# Patient Record
Sex: Female | Born: 1965 | Race: White | Hispanic: No | Marital: Single | Smoking: Current every day smoker
Health system: Southern US, Community
[De-identification: ages and names within clinical notes are randomized; demographics above are authoritative.]

## PROBLEM LIST (undated history)

## (undated) DIAGNOSIS — G629 Polyneuropathy, unspecified: Secondary | ICD-10-CM

## (undated) DIAGNOSIS — D71 Functional disorders of polymorphonuclear neutrophils: Secondary | ICD-10-CM

## (undated) DIAGNOSIS — J449 Chronic obstructive pulmonary disease, unspecified: Secondary | ICD-10-CM

## (undated) DIAGNOSIS — E7800 Pure hypercholesterolemia, unspecified: Secondary | ICD-10-CM

## (undated) DIAGNOSIS — I10 Essential (primary) hypertension: Secondary | ICD-10-CM

## (undated) DIAGNOSIS — D718 Other functional disorders of polymorphonuclear neutrophils: Secondary | ICD-10-CM

## (undated) DIAGNOSIS — F319 Bipolar disorder, unspecified: Secondary | ICD-10-CM

## (undated) DIAGNOSIS — K5090 Crohn's disease, unspecified, without complications: Secondary | ICD-10-CM

## (undated) DIAGNOSIS — M797 Fibromyalgia: Secondary | ICD-10-CM

## (undated) DIAGNOSIS — J45909 Unspecified asthma, uncomplicated: Secondary | ICD-10-CM

## (undated) DIAGNOSIS — M3500 Sicca syndrome, unspecified: Secondary | ICD-10-CM

## (undated) DIAGNOSIS — M479 Spondylosis, unspecified: Secondary | ICD-10-CM

## (undated) DIAGNOSIS — H527 Unspecified disorder of refraction: Secondary | ICD-10-CM

## (undated) DIAGNOSIS — K116 Mucocele of salivary gland: Secondary | ICD-10-CM

## (undated) HISTORY — DX: Mucocele of salivary gland: K11.6

## (undated) HISTORY — DX: Functional disorders of polymorphonuclear neutrophils: D71

## (undated) HISTORY — DX: Fibromyalgia: M79.7

## (undated) HISTORY — DX: Sjogren syndrome, unspecified: M35.00

## (undated) HISTORY — DX: Pure hypercholesterolemia, unspecified: E78.00

## (undated) HISTORY — DX: Bipolar disorder, unspecified: F31.9

## (undated) HISTORY — DX: Essential (primary) hypertension: I10

## (undated) HISTORY — DX: Crohn's disease, unspecified, without complications: K50.90

## (undated) HISTORY — DX: Chronic obstructive pulmonary disease, unspecified: J44.9

## (undated) HISTORY — DX: Spondylosis, unspecified: M47.9

## (undated) HISTORY — DX: Unspecified disorder of refraction: H52.7

## (undated) HISTORY — PX: TUBAL LIGATION: SHX77

## (undated) HISTORY — PX: FOOT SURGERY: SHX648

## (undated) HISTORY — PX: TONSILLECTOMY: SUR1361

## (undated) HISTORY — PX: OTHER SURGICAL HISTORY: SHX169

## (undated) HISTORY — PX: CHOLECYSTECTOMY: SHX55

---

## 2003-08-15 ENCOUNTER — Encounter: Payer: Self-pay | Admitting: Cardiology

## 2008-11-29 ENCOUNTER — Ambulatory Visit
Admit: 2008-11-29 | Discharge: 2008-12-10 | Disposition: A | Discharge status: Home / Self Care | Payer: Self-pay | Source: Ambulatory Visit | Attending: Psychiatry | Admitting: Psychiatry

## 2008-12-11 ENCOUNTER — Ambulatory Visit
Admit: 2008-12-11 | Discharge: 2009-01-10 | Disposition: A | Discharge status: Home / Self Care | Payer: Self-pay | Source: Ambulatory Visit | Attending: Psychiatry | Admitting: Psychiatry

## 2008-12-12 LAB — ETHANOL, URINE: Ethanol,UR: NEGATIVE

## 2008-12-12 LAB — THC SEMI-QUANT, URINE
Semi-Quant THC,UR: 160 ng/mL
THC/Creat Ratio: 516 ng/mg

## 2008-12-12 LAB — QUANT THC, URINE: Creatinine,UR: 31 mg/dL (ref 20–300)

## 2008-12-12 LAB — DRUG SCREEN CHEMICAL DEPENDENCY, URINE
Amphetamine,UR: NEGATIVE
Benzodiazepinen,UR: NEGATIVE
Cocaine/Metab,UR: NEGATIVE
Opiates,UR: NEGATIVE
THC Metabolite,UR: POSITIVE

## 2008-12-12 LAB — CONFIRM THC METABOLITE, URINE: Confirm THC Metab: POSITIVE

## 2008-12-13 LAB — DATE/TIME NOT PROVIDED

## 2008-12-14 LAB — DRUG SCREEN CHEMICAL DEPENDENCY, URINE
Amphetamine,UR: NEGATIVE
Benzodiazepinen,UR: NEGATIVE
Cocaine/Metab,UR: NEGATIVE
Opiates,UR: NEGATIVE
THC Metabolite,UR: POSITIVE

## 2008-12-14 LAB — CONFIRM THC METABOLITE, URINE: Confirm THC Metab: POSITIVE

## 2009-01-11 ENCOUNTER — Ambulatory Visit
Admit: 2009-01-11 | Discharge: 2009-02-10 | Disposition: A | Discharge status: Home / Self Care | Payer: Self-pay | Source: Ambulatory Visit | Attending: Psychiatry | Admitting: Psychiatry

## 2009-03-24 ENCOUNTER — Ambulatory Visit: Payer: Self-pay | Admitting: Otolaryngology

## 2009-05-07 ENCOUNTER — Ambulatory Visit: Admit: 2009-05-07 | Discharge: 2009-05-07 | Disposition: A | Discharge status: Home / Self Care | Payer: Self-pay

## 2009-06-17 ENCOUNTER — Ambulatory Visit: Admit: 2009-06-17 | Discharge: 2009-06-17 | Disposition: A | Discharge status: Home / Self Care | Payer: Self-pay

## 2009-09-17 ENCOUNTER — Ambulatory Visit: Payer: Self-pay | Admitting: Allergy/Immunology/Rheumatology

## 2009-09-17 ENCOUNTER — Other Ambulatory Visit: Payer: Self-pay | Admitting: Allergy/Immunology/Rheumatology

## 2009-09-17 LAB — C3 COMPLEMENT: C3: 132 mg/dL (ref 90–180)

## 2009-09-17 LAB — C4 COMPLEMENT: C4: 34 mg/dL (ref 10–40)

## 2009-09-17 LAB — CRP: CRP: 4 mg/L (ref 0–10)

## 2009-09-17 LAB — SEDIMENTATION RATE, AUTOMATED: Sedimentation Rate: 29 mm/h — ABNORMAL HIGH (ref 0–20)

## 2009-09-18 LAB — ANTINUCLEAR ANTIBODY SCREEN: ANA Screen: NEGATIVE

## 2009-09-19 LAB — ANTI RNP/SMITH
Anti-RNP: 1.1 AI — ABNORMAL HIGH (ref 0.0–0.9)
Anti-Smith: <0.2 AI (ref 0.0–0.9)
SM/RNP AB: <0.2 AI (ref 0.0–0.9)

## 2009-09-19 LAB — ANTI-SSA/SSB
Anti-LA/SS-B: <0.2 AI (ref 0.0–0.9)
Anti-RO/SS-A: <0.2 AI (ref 0.0–0.9)

## 2009-09-19 LAB — ANTI DS-DNA AB: dsDNA Ab: <1 [IU]/mL (ref 0–4)

## 2009-09-19 NOTE — Progress Notes (Signed)
Chief Complaint     Evaluation for joint pain and lymphadenopathy  HPI   I had the pleasure of seeing Kathy Barrett in my clinic at the request of Dr.  Daneil Dan for evaluation of lymphadenopathy and joint pain.     Kathy Barrett tells me that she has had chronic pain for years and she is hurting  all over.  Basically all of the joints seem to be affected.  She also has  S1/S2 sciatica. There are plans I believe for her to go to the [facility]  for pain management.  The symptoms are worse in the last year. She is  taking Mobic twice a day with no help.  She hurts all the time but worse in  cold damp weather and also at the end of the day.  She has swelling of the  ankles.  She is stiff in the morning for about 1 hour. She was diagnosed  with Crohn's disease with a biopsy.  All of the work up has been done at  [facility].     She also had an episode of parotitis on the right side and she has had  persistent lymphadenopathy since then. She has been on antibiotics for 6  months.  I saw in 1 note that as a child she was diagnosed with chronic  granulomatous disease but she has not had issues with infection throughout  the years.     She reports no Raynauds, no rashes, no photosensitivity, no sores in the  mouth. She has some sores in the nose, dry mouth, shortness of breath  secondary to the COPD, diarrhea, bloating, cramping, occasional bloody  bowel movements..  Allergies   None.  Current Meds   Risperidone 0.5 MG Tablet;Take 1 tablet twice daily as needed for   mania/agitation; take 2 tablets at bedtime; Rx  HydrOXYzine HCl 50 MG Tablet;TAKE 1 TABLET 4 TIMES DAILY PRN anxiety; Rx  LamoTRIgine 25 MG Tablet;TAKE 1 TABLET DAILY AT BEDTIME.; Rx  Venlafaxine HCl 150 MG Tablet Extended Release 24 Hour;TAKE 1 TABLET DAILY   IN THE MORNING; Rx.  ROS   CONSTITUTIONAL: Appetite good; no weight loss.  No fevers, chills, or   sweats.  No fatigue.  NEUROLOGICAL:  No headaches, numbness, weak muscles or fainting or dizzy    spells.  RESPIRATORY:  + chronic cough no hemoptysis; + shortness of breath; no   wheezing or loud snoring.  CARDIOVASCULAR:  No heart palpitations, angina or chest pain.  No change in   color of fingers in the cold.  INTEGUMENT:  No hair loss.  No rashes or bumps on the skin; no rash in the   sun.  No skin thickening.  MUSCULOSKELETAL:  As per HPI  EENT:  No eye pain, dry eyes, redness in the eyes; no vision loss.  No   ringing in the ears or hearing loss.  No nasal congestion.  + dry mouth, no   mouth sores or ulcers; no recent dental work.  No hoarseness or sore throat.  HEMATOLOGIC:  No swollen glands.  No lump or growth.  No bleeding tendency.  GASTROINTESTINAL:  No difficulty swallowing.  No heartburn.  No nausea or   vomiting; + abdominal pain.  + constipation or diarrhea.  + rectal bleeding   or black bowel movements.  GENITOURINARY:  No problems passing urine.  No burning on urination.  No   blood in the urine.  No discharge from penis/vagina.  No genital sores or  rash.  No possibility of pregnancy.  No breast lump.  PSYCHIATRIC:  No anxiety or depression.  No difficulty falling asleep or   staying asleep.  Active Problems   None.  PMH   Bipolar  Crohn's disease  HTN  hyperlipidemia  history of alcohol abuse.  Family Hx   Mother - SLE and MS.  Personal Hx   On SSD due to bipolar and DJD  smoking 1ppd X 33 years, had problems with alcohol, now drinks rarely.  Vital Signs   See scanned sheet.  Physical Exam   GENERAL APPEARANCE: Appears stated age, very, very strong tobaco odor  HEENT: PERRL, EOMI,  oropharynx clear, dry mouth, poor dentition, small   palpable lymphadenopathy R submandibular  LUNGS: Diffuse ronchi bilateral  HEART: Normal S1,S2 without murmurs, gallops, or rubs  ABDOMEN: NABS, soft, non-tender, without hepato-splenomegaly  EXTREMITIES: Without clubbing, cyanosis, or edema  NEUROLOGIC: Alert and oriented x3, cranial nerves II-XII grossly intact  MSK: all fibromyalgia points tender, no  synovitis  SKIN: tatoos.  Assessment   This is a 44 year old female with chronic pain and fibromyalgia.  I do not  think there is any doubt regarding these two diagnoses.  Her physical exam  is very much suggestive of that and I have not seen anything to suggest an  active inflammatory arthropathy.  Nevertheless with Crohn's disease one  could get such an inflammatory arthropathy.  Since there is no serological  data available to make that diagnosis, I think a bone scan might be helpful  in that regard.     Regarding the lymphadenopathy it is probably related to the parotitis. From  the very limited notes I have it looks like she had a parotid abscess.  She  does have dry mouth and with the parotic enlargement, I do wonder about the  possibility of Sjogren's although she is on a lot of medications that could  give her dryness of the mouth.  Nevertheless, given the family history of  autoimmune disease (her mother has lupus and multiple sclerosis) we will do  that work up.     PLAN:     1. Check ANA, ENAs, double stranded DNA, complements, sedimentation rate     and CRP.     2. Whole body bone scan.     3. Follow up on the results and let her know if she needs to return to     the clinic.     Thank you for this referral..  Signature   Electronically signed by: Harland German  M.D.; 09/19/2009 12:56 PM EST;   Chartered loss adjuster.

## 2009-09-25 ENCOUNTER — Ambulatory Visit: Admit: 2009-09-25 | Payer: Self-pay | Source: Ambulatory Visit | Admitting: Allergy/Immunology/Rheumatology

## 2009-11-28 ENCOUNTER — Other Ambulatory Visit: Payer: Self-pay | Admitting: Allergy/Immunology/Rheumatology

## 2009-12-09 ENCOUNTER — Ambulatory Visit
Admit: 2009-12-09 | Discharge: 2009-12-09 | Disposition: A | Discharge status: Home / Self Care | Payer: Self-pay | Source: Ambulatory Visit | Attending: Allergy/Immunology/Rheumatology | Admitting: Allergy/Immunology/Rheumatology

## 2009-12-10 ENCOUNTER — Other Ambulatory Visit: Payer: Self-pay | Admitting: Radiology

## 2011-10-19 ENCOUNTER — Encounter: Payer: Self-pay | Admitting: Ophthalmology

## 2011-10-19 ENCOUNTER — Ambulatory Visit: Payer: Self-pay | Admitting: Ophthalmology

## 2011-10-19 VITALS — BP 117/69 | HR 77 | Ht 62.0 in | Wt 160.0 lb

## 2011-10-19 DIAGNOSIS — M479 Spondylosis, unspecified: Secondary | ICD-10-CM | POA: Insufficient documentation

## 2011-10-19 DIAGNOSIS — M797 Fibromyalgia: Secondary | ICD-10-CM | POA: Insufficient documentation

## 2011-10-19 DIAGNOSIS — G510 Bell's palsy: Secondary | ICD-10-CM

## 2011-10-19 DIAGNOSIS — H527 Unspecified disorder of refraction: Secondary | ICD-10-CM | POA: Insufficient documentation

## 2011-10-19 DIAGNOSIS — K116 Mucocele of salivary gland: Secondary | ICD-10-CM | POA: Insufficient documentation

## 2011-10-19 DIAGNOSIS — M3500 Sicca syndrome, unspecified: Secondary | ICD-10-CM | POA: Insufficient documentation

## 2011-10-19 DIAGNOSIS — S0430XA Injury of trigeminal nerve, unspecified side, initial encounter: Secondary | ICD-10-CM

## 2011-10-19 DIAGNOSIS — F319 Bipolar disorder, unspecified: Secondary | ICD-10-CM | POA: Insufficient documentation

## 2011-10-19 DIAGNOSIS — I10 Essential (primary) hypertension: Secondary | ICD-10-CM | POA: Insufficient documentation

## 2011-10-19 DIAGNOSIS — D71 Functional disorders of polymorphonuclear neutrophils: Secondary | ICD-10-CM | POA: Insufficient documentation

## 2011-10-19 DIAGNOSIS — E7800 Pure hypercholesterolemia, unspecified: Secondary | ICD-10-CM | POA: Insufficient documentation

## 2011-10-19 DIAGNOSIS — J449 Chronic obstructive pulmonary disease, unspecified: Secondary | ICD-10-CM | POA: Insufficient documentation

## 2011-10-19 NOTE — Assessment & Plan Note (Signed)
The patient has evidence of a mandibular branch of cranial nerve V involvement on a post-surgical basis.

## 2011-10-19 NOTE — Progress Notes (Deleted)
Subjective:           Current Outpatient Rx   Name  Route  Sig  Dispense  Refill   . sulfamethoxazole-trimethoprim (BACTRIM,SEPTRA) 400-80 MG per tablet    Oral    Take 1 tablet by mouth daily             . prochlorperazine (COMPAZINE) 10 MG tablet    Oral    Take 10 mg by mouth 2 times daily as needed             . DULoxetine (CYMBALTA) 60 MG capsule    Oral    Take 60 mg by mouth daily             . meloxicam (MOBIC) 15 MG tablet    Oral    Take 15 mg by mouth daily   Take with food.             Marland Kitchen dexlansoprazole (DEXILANT) 60 MG capsule    Oral    Take 60 mg by mouth daily             . metoprolol (TOPROL-XL) 100 MG 24 hr tablet    Oral    Take 100 mg by mouth daily   Do not crush or chew. May be divided.             Marland Kitchen lisinopril (PRINIVIL,ZESTRIL) 10 MG tablet    Oral    Take 10 mg by mouth daily             . budesonide (ENTOCORT EC) 3 MG 24 hr capsule    Oral    Take 6 mg by mouth every morning   Swallow whole. Do not crush or chew.             . dicyclomine (BENTYL) 10 MG capsule    Oral    Take 10 mg by mouth 4 times daily (before meals and nightly)             . Venlafaxine HCl 150 MG TB24    Oral    TAKE 1 TABLET DAILY IN THE MORNING    15    0       Created by Conversion     . lamoTRIgine (LAMICTAL) 25 MG tablet    Oral    TAKE 1 TABLET DAILY AT BEDTIME.    15    0       Created by Conversion     . hydrOXYzine (ATARAX) 50 MG tablet    Oral    TAKE 1 TABLET 4 TIMES DAILY PRN anxiety    60    0       Created by Conversion     . risperiDONE (RISPERDAL) 0.5 MG tablet    Oral    Take 1 tablet twice daily as needed for mania/agitation; take 2 tablets at bedtime    60    0       Created by Conversion        Morphine and Penicillins   Past Medical History   Diagnosis Date   . Chronic granulomatous disease    . Sjogren's syndrome    . Refractive error    . Cyst of parotid gland    . Hypertension    . High cholesterol    . Fibromyalgia    . Bipolar affective disorder    . COPD (chronic obstructive pulmonary  disease)    . Degenerative joint disease of  spine       Past Surgical History   Procedure Laterality Date   . Tubal ligation  1994   . Tonsillectomy     . Lumbar hernia repair     . Cholecystectomy        History   Smoking status   . Former Smoker -- 1.00 packs/day   . Types: Cigarettes   Smokeless tobacco   . Not on file      History   Alcohol Use   . Yes     Once a year      History   Drug Use   . 3.00 per week   . Special: Marijuana      Specialty Problems    None               Objective:   Objective   Filed Vitals:    10/19/11 1351   BP: 117/69   Pulse: 77   Height: 1.575 m (5\' 2" )   Weight: 72.576 kg (160 lb)       Base Eye Exam       Visual Acuity    Right Left   Dist sc 20/60 -1 20/30 -2   Dist ph sc 20/25-2    Near sc J2 J1+   Method: Snellen - Linear   Tonometry    Right Left   Pressure 16 14   Method: Tonopen   Time: 1:46 PM   Pupils    Pupils Dark Light React APD   Right PERRLA 5 3 Brisk None   Left PERRLA 5 3 Brisk None   Visual Fields    Left Right   Result Full Full   Extraocular Movement    Right Left   Result Full, Ortho Full, Ortho   Neuro/Psych   Oriented x3: Yes   Mood/Affect: Normal      Additional Tests       Amsler    Right Left   Amsler See drawing Normal   Color    Right Left   Color 14/14 14/14   Method: HRR      Slit Lamp and Fundus Exam       External Exam    Right Left   Exophthalmometry - Base: 92 mm 20 mm 20 mm   Palpebral fissure 10 mm 10 mm   Levator 13 mm 14 mm   Slit Lamp Exam    Right Left   Lids/Lashes The patient has almost complete inability to close the right eye even with forced lid closure. Moderate Bell's phenomenon Normal structure & position   Conjunctiva/Sclera Normal bulbar/palpebral, conjunctiva, sclera Normal bulbar/palpebral, conjunctiva, sclera   Cornea Temporal Inferior-temporal , 2+ Punctate epithelial erosions Normal epithelium, stroma, endothelium, tear film   Anterior Chamber Clear & deep Clear & deep   Iris Normal shape, size, morphology Normal shape, size,  morphology   Lens Normal cortex, nucleus, anterior/posterior capsule, clarity Normal cortex, nucleus, anterior/posterior capsule, clarity   Vitreous Clear Clear   Fundus Exam    Right Left   Disc Normal size, appearance, nerve fiber layer Normal size, appearance, nerve fiber layer   C/D Ratio 0.3 0.3   Macula Normal Normal   Vessels Normal Normal   Periphery Normal Normal      Lacrimal Exam       Schirmers    Right Left   Schirmers 5 8   Anesthesia: Yes      Refraction       Manifest Refraction  Sphere Cylinder Dist   Right -1.00 Sphere 20/25   Left -1.25 Sphere 20/20        Neurologic Exam     Cranial Nerves      CN I   Right Olfactory: Smell Intact to cloves.   Left Olfactory: Smell Intact to cloves.      CN V   Right facial sensation deficit: cheeks and mandible  Left facial sensation deficit: none     CN VII   Right facial weakness: peripheral  Left facial weakness: none     CN VIII   Hearing: intact     CN IX, X   Palate: symmetric     CN XI   CN XI normal.      CN XII   Tongue deviation: right              No annotated images are attached to the encounter.          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  The patient is doing an excellent job of protecting the cornea.  She will come back in one month or for symptoms of increased exposure.        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

## 2011-10-19 NOTE — Assessment & Plan Note (Signed)
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  The patient is doing an excellent job of protecting the cornea.  She will come back in one month or for symptoms of increased exposure.

## 2011-10-19 NOTE — Progress Notes (Signed)
Subjective:   Subjective 10/19/2011   No chief complaint on file.        Current Outpatient Rx   Name  Route  Sig  Dispense  Refill   . sulfamethoxazole-trimethoprim (BACTRIM,SEPTRA) 400-80 MG per tablet    Oral    Take 1 tablet by mouth daily             . prochlorperazine (COMPAZINE) 10 MG tablet    Oral    Take 10 mg by mouth 2 times daily as needed             . DULoxetine (CYMBALTA) 60 MG capsule    Oral    Take 60 mg by mouth daily             . meloxicam (MOBIC) 15 MG tablet    Oral    Take 15 mg by mouth daily   Take with food.             Marland Kitchen dexlansoprazole (DEXILANT) 60 MG capsule    Oral    Take 60 mg by mouth daily             . metoprolol (TOPROL-XL) 100 MG 24 hr tablet    Oral    Take 100 mg by mouth daily   Do not crush or chew. May be divided.             Marland Kitchen lisinopril (PRINIVIL,ZESTRIL) 10 MG tablet    Oral    Take 10 mg by mouth daily             . budesonide (ENTOCORT EC) 3 MG 24 hr capsule    Oral    Take 6 mg by mouth every morning   Swallow whole. Do not crush or chew.             . dicyclomine (BENTYL) 10 MG capsule    Oral    Take 10 mg by mouth 4 times daily (before meals and nightly)             . Venlafaxine HCl 150 MG TB24    Oral    TAKE 1 TABLET DAILY IN THE MORNING    15    0       Created by Conversion     . lamoTRIgine (LAMICTAL) 25 MG tablet    Oral    TAKE 1 TABLET DAILY AT BEDTIME.    15    0       Created by Conversion     . hydrOXYzine (ATARAX) 50 MG tablet    Oral    TAKE 1 TABLET 4 TIMES DAILY PRN anxiety    60    0       Created by Conversion     . risperiDONE (RISPERDAL) 0.5 MG tablet    Oral    Take 1 tablet twice daily as needed for mania/agitation; take 2 tablets at bedtime    60    0       Created by Conversion        Morphine and Penicillins   Past Medical History   Diagnosis Date   . Chronic granulomatous disease    . Sjogren's syndrome    . Refractive error    . Cyst of parotid gland    . Hypertension    . High cholesterol    . Fibromyalgia    . Bipolar affective  disorder    . COPD (chronic obstructive pulmonary disease)    .  Degenerative joint disease of spine       Past Surgical History   Procedure Laterality Date   . Tubal ligation  1994   . Tonsillectomy     . Lumbar hernia repair     . Cholecystectomy        History   Smoking status   . Former Smoker -- 1.00 packs/day   . Types: Cigarettes   Smokeless tobacco   . Not on file      History   Alcohol Use   . Yes     Once a year      History   Drug Use   . 3.00 per week   . Special: Marijuana      Specialty Problems       Ophthalmology Problems    Refractive error        Sjogren's syndrome                   Objective:   Objective   Filed Vitals:    10/19/11 1351   BP: 117/69   Pulse: 77   Height: 1.575 m (5\' 2" )   Weight: 72.576 kg (160 lb)       Base Eye Exam       Visual Acuity    Right Left   Dist sc 20/60 -1 20/30 -2   Dist ph sc 20/25-2    Near sc J2 J1+   Method: Snellen - Linear   Tonometry    Right Left   Pressure 16 14   Method: Tonopen   Time: 1:46 PM   Pupils    Pupils Dark Light React APD   Right PERRLA 5 3 Brisk None   Left PERRLA 5 3 Brisk None   Visual Fields    Left Right   Result Full Full   Extraocular Movement    Right Left   Result Full, Ortho Full, Ortho   Neuro/Psych   Oriented x3: Yes   Mood/Affect: Normal      Additional Tests       Amsler    Right Left   Amsler See drawing Normal   Color    Right Left   Color 14/14 14/14   Method: HRR      Slit Lamp and Fundus Exam       External Exam    Right Left   Exophthalmometry - Base: 92 mm 20 mm 20 mm   Palpebral fissure 10 mm 10 mm   Levator 13 mm 14 mm   Slit Lamp Exam    Right Left   Lids/Lashes The patient has almost complete inability to close the right eye even with forced lid closure. Moderate Bell's phenomenon Normal structure & position   Conjunctiva/Sclera Normal bulbar/palpebral, conjunctiva, sclera Normal bulbar/palpebral, conjunctiva, sclera   Cornea Temporal Inferior-temporal , 2+ Punctate epithelial erosions Normal epithelium, stroma,  endothelium, tear film   Anterior Chamber Clear & deep Clear & deep   Iris Normal shape, size, morphology Normal shape, size, morphology   Lens Normal cortex, nucleus, anterior/posterior capsule, clarity Normal cortex, nucleus, anterior/posterior capsule, clarity   Vitreous Clear Clear   Fundus Exam    Right Left   Disc Normal size, appearance, nerve fiber layer Normal size, appearance, nerve fiber layer   C/D Ratio 0.3 0.3   Macula Normal Normal   Vessels Normal Normal   Periphery Normal Normal      Lacrimal Exam       Schirmers  Right Left   Schirmers 5 8   Anesthesia: Yes      Refraction       Manifest Refraction    Sphere Cylinder Dist   Right -1.00 Sphere 20/25   Left -1.25 Sphere 20/20        Neurologic Exam     Mental Status   Oriented to person, place, and time.           Cranial Nerves      CN I   Right Olfactory: Smell Intact to cloves.   Left Olfactory: Smell Intact to cloves.      CN II   Visual fields full to confrontation.   Visual acuity: normal  Right visual field deficit: none  Left visual field deficit: none      CN III, IV, VI   Pupils are equal, round, and reactive to light.  Right pupil: Size: 3 mm. Shape: regular. Reactivity: brisk. Consensual response: intact. Accommodation: intact.   Left pupil: Size: 3 mm. Shape: regular. Reactivity: brisk. Consensual response: intact. Accommodation: intact.   Nystagmus: none   Upgaze: normal  Downgaze: normal     CN VIII   Hearing: intact     CN IX, X   CN IX normal.   Right gag reflex: normal     CN XI   Right sternocleidomastoid strength: normal  Left sternocleidomastoid strength: normal  Right trapezius strength: normal  Left trapezius strength: normal     CN XII   Tongue: not atrophic  Fasciculations: absent  Tongue deviation: right              No annotated images are attached to the encounter.          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  The  patient is doing an excellent job of protecting the cornea.  She will come back in one month or for symptoms of increased exposure.        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

## 2011-10-24 ENCOUNTER — Encounter: Payer: Self-pay | Admitting: Ophthalmology

## 2011-11-23 ENCOUNTER — Ambulatory Visit: Payer: Self-pay | Admitting: Ophthalmology

## 2011-11-23 ENCOUNTER — Encounter: Payer: Self-pay | Admitting: Ophthalmology

## 2011-11-23 DIAGNOSIS — H0012 Chalazion right lower eyelid: Secondary | ICD-10-CM | POA: Insufficient documentation

## 2011-11-23 DIAGNOSIS — K5090 Crohn's disease, unspecified, without complications: Secondary | ICD-10-CM | POA: Insufficient documentation

## 2011-11-23 DIAGNOSIS — G510 Bell's palsy: Secondary | ICD-10-CM

## 2011-11-23 NOTE — Assessment & Plan Note (Signed)
The patient has a chalazion of the right lower lid.  She has been prescribed Tobrex ointment bid for 10 days.

## 2011-11-23 NOTE — Assessment & Plan Note (Signed)
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  The patient will continue to use taping and drops as there is no corneal breakdown or conjunctival irritation.  Botox has been offered as an alternative, but she is deferring such management for now.  The patient will return in one month for follow-up.

## 2011-11-24 NOTE — Progress Notes (Signed)
Subjective:   Subjective 11/23/2011   No chief complaint on file.    HPI    The patient returns for 1 month follow up for post parotid surgery right   seventh facial nerve palsy with marked lagophthalmos due to seventh   cranial nerve injury.  The patient feels she is not seeing as well from   the right eye and she is having pain in both eyes. She feels a "bump"   laterally in the right lower lid.  She is not taping the eye as much at   night due to sensitivity to the adhesive.         Current Outpatient Rx   Name  Route  Sig  Dispense  Refill   . sulfamethoxazole-trimethoprim (BACTRIM,SEPTRA) 400-80 MG per tablet    Oral    Take 1 tablet by mouth daily             . prochlorperazine (COMPAZINE) 10 MG tablet    Oral    Take 10 mg by mouth 2 times daily as needed             . DULoxetine (CYMBALTA) 60 MG capsule    Oral    Take 60 mg by mouth daily             . dexlansoprazole (DEXILANT) 60 MG capsule    Oral    Take 60 mg by mouth daily             . metoprolol (TOPROL-XL) 100 MG 24 hr tablet    Oral    Take 100 mg by mouth daily   Do not crush or chew. May be divided.             Marland Kitchen lisinopril (PRINIVIL,ZESTRIL) 10 MG tablet    Oral    Take 10 mg by mouth daily             . budesonide (ENTOCORT EC) 3 MG 24 hr capsule    Oral    Take 6 mg by mouth every morning   Swallow whole. Do not crush or chew.             . dicyclomine (BENTYL) 10 MG capsule    Oral    Take 10 mg by mouth 4 times daily (before meals and nightly)             . hydrOXYzine (ATARAX) 50 MG tablet    Oral    TAKE 1 TABLET 4 TIMES DAILY PRN anxiety    60    0       Created by Conversion     . meloxicam (MOBIC) 15 MG tablet    Oral    Take 15 mg by mouth daily   Take with food.             . Venlafaxine HCl 150 MG TB24    Oral    TAKE 1 TABLET DAILY IN THE MORNING    15    0       Created by Conversion     . lamoTRIgine (LAMICTAL) 25 MG tablet    Oral    TAKE 1 TABLET DAILY AT BEDTIME.    15    0       Created by Conversion     . risperiDONE  (RISPERDAL) 0.5 MG tablet    Oral    Take 1 tablet twice daily as needed for mania/agitation; take 2 tablets at bedtime  60    0       Created by Conversion        Morphine and Penicillins   Past Medical History   Diagnosis Date   . Chronic granulomatous disease    . Sjogren's syndrome    . Refractive error    . Cyst of parotid gland    . Hypertension    . High cholesterol    . Fibromyalgia    . Bipolar affective disorder    . COPD (chronic obstructive pulmonary disease)    . Degenerative joint disease of spine    . Crohn's disease       Past Surgical History   Procedure Laterality Date   . Tubal ligation  1994   . Tonsillectomy     . Lumbar hernia repair     . Cholecystectomy        History   Smoking status   . Former Smoker -- 1.00 packs/day   . Types: Cigarettes   Smokeless tobacco   . Not on file      History   Alcohol Use   . Yes     Once a year      History   Drug Use   . 3.00 per week   . Special: Marijuana      Specialty Problems       Ophthalmology Problems    Refractive error        Sjogren's syndrome        Chalazion of right lower eyelid               ROS    Positive for: Musculoskeletal    Negative for: Constitutional, Gastrointestinal, Neurological, Skin,   Genitourinary, HENT, Endocrine, Cardiovascular, Eyes, Respiratory,   Psychiatric, Allergic/Imm, Heme/Lymph        Objective:   Objective There were no vitals filed for this visit.    Base Eye Exam       Visual Acuity    Right Left   Dist sc 20/60 -2 20/40 +1   Dist ph sc 20/30-2 20/25-1   Method: Snellen - Linear      Slit Lamp and Fundus Exam       External Exam    Right Left   External complete facial palsy    Lagophthalmos 7 mm    Slit Lamp Exam    Right Left   Lids/Lashes Temporal , Chalazion: Lower lid    Conjunctiva/Sclera Normal bulbar/palpebral, conjunctiva, sclera    Cornea Normal epithelium, stroma, endothelium, tear film    Anterior Chamber Clear & deep    Iris Normal shape, size, morphology    Lens Normal cortex, nucleus,  anterior/posterior capsule, clarity    Vitreous Clear       Refraction       Manifest Refraction    Sphere Cylinder Axis Dist Add   Right -1.25 +0.75 005 20/20-1 +1.25   Left -1.25 +0.25 180 20/20 +1.25   Final Rx    Sphere Cylinder Axis Add   Right -1.25 +0.75 005 +1.25   Left -1.25 +0.25 180 +1.25        Neurologic Exam [facility]   Final Rx    Sphere Cylinder Axis Add   Right -1.25 +0.75 005 +1.25   Left -1.25 +0.25 180 +1.25                No annotated images are attached to the encounter.  Assessment/Plan:   AssessmentChalazion of right lower eyelid  The patient has a chalazion of the right lower lid.  She has been prescribed Tobrex ointment bid for 10 days.      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  The patient will continue to use taping and drops as there is no corneal breakdown or conjunctival irritation.  Botox has been offered as an alternative, but she is deferring such management for now.  The patient will return in one month for follow-up.

## 2012-01-18 ENCOUNTER — Ambulatory Visit: Payer: Self-pay | Admitting: Ophthalmology

## 2013-05-24 ENCOUNTER — Encounter: Payer: Self-pay | Admitting: Gastroenterology

## 2013-06-13 ENCOUNTER — Telehealth: Payer: Self-pay | Admitting: Pain Medicine

## 2013-06-13 ENCOUNTER — Encounter: Payer: Self-pay | Admitting: Gastroenterology

## 2013-06-13 NOTE — Telephone Encounter (Signed)
NPV

## 2013-06-15 NOTE — Telephone Encounter (Signed)
Patient has been scheduled

## 2013-08-20 ENCOUNTER — Ambulatory Visit: Payer: Self-pay | Admitting: Neurosurgery

## 2013-11-05 ENCOUNTER — Ambulatory Visit: Payer: Self-pay | Admitting: Neurosurgery

## 2014-03-22 ENCOUNTER — Encounter: Payer: Self-pay | Admitting: Gastroenterology

## 2014-03-26 ENCOUNTER — Telehealth: Payer: Self-pay

## 2014-03-26 NOTE — Telephone Encounter (Signed)
Left msg. for patient to call for phone screen/intake.  Referral sent to scanning.

## 2014-03-27 ENCOUNTER — Telehealth: Payer: Self-pay

## 2014-03-27 NOTE — Telephone Encounter (Signed)
Patient will call back    NPV GNE  PERIPHERAL NEUROPATHY  LONG,JEFFREY  [phone]

## 2014-03-29 ENCOUNTER — Telehealth: Payer: Self-pay

## 2014-03-29 NOTE — Telephone Encounter (Signed)
[facility] - Ambulatory Telephone Intake Screen     Patient Information:    Patient's Name: Kathy ComfortBarbara Hellmann  Patient's Date of Birth: [birth year]  Patient's Medical Record Number: [id number]  Patient's Home Phone: [phone] (home)  Patient's Work Phone: [phone] (work)  United AutoPatient's Mobile Phone:   No relevant phone numbers on file.     Patient's Marital Status (if applicable): Married  Can a message be left? YES      Insurance Information:    MEDICARE/MEDICAID  Policy number:       Referral Source:  Referral Source: SELF      Presenting Concern:     What is the reason you are seeking care?     (Please provide a brief description of the reasons the patient or referring provider is seeking mental health treatment at this time.  Please use patients own words where possible).    DEPRESSION AND ANXIETY, A LOT OF HIGHS AND LOWS, MOOD SWINGS                                    Mental Health History:    Are you currently involved in mental health treatment?  NO     Are you currently taking a long acting injectable medication?  NO    Will you need an injection at the time of your first intake assessment appointment?   NO      Customer Service:    Do you need arrangements for?    Wheelchair: Programmer, applicationsO    Interpreter Services: NO

## 2014-03-29 NOTE — Telephone Encounter (Signed)
Intake scheduled: 4/12 at 10:30am with Janus MolderNatalie McLaren, fee interview at 9:45am

## 2014-04-23 NOTE — Progress Notes (Signed)
[facility] MISSED/CANCELLED APPOINTMENT     Name: Zeb ComfortBARBARA Barrett  MRN: [id number]   DOB: 06/15/1965    Date of Scheduled Service: 04/23/14    [name] was a no show for today's appointment.  I have sent the patient a letter regarding today's missed appointment.    Additional Information:    Not applicable

## 2014-05-01 ENCOUNTER — Ambulatory Visit: Payer: Self-pay | Admitting: Ophthalmology

## 2014-06-19 ENCOUNTER — Ambulatory Visit: Payer: Self-pay | Admitting: Neurology

## 2015-07-23 ENCOUNTER — Encounter: Payer: Self-pay | Admitting: Emergency Medicine

## 2015-07-23 ENCOUNTER — Emergency Department
Admission: EM | Admit: 2015-07-23 | Discharge: 2015-07-23 | Discharge status: Against Medical Advice | Payer: Self-pay | Attending: Emergency Medicine | Admitting: Emergency Medicine

## 2015-07-23 ENCOUNTER — Emergency Department: Payer: Self-pay

## 2015-07-23 DIAGNOSIS — E7800 Pure hypercholesterolemia, unspecified: Secondary | ICD-10-CM | POA: Insufficient documentation

## 2015-07-23 DIAGNOSIS — I10 Essential (primary) hypertension: Secondary | ICD-10-CM | POA: Insufficient documentation

## 2015-07-23 DIAGNOSIS — F17210 Nicotine dependence, cigarettes, uncomplicated: Secondary | ICD-10-CM | POA: Insufficient documentation

## 2015-07-23 DIAGNOSIS — F319 Bipolar disorder, unspecified: Secondary | ICD-10-CM | POA: Insufficient documentation

## 2015-07-23 DIAGNOSIS — J441 Chronic obstructive pulmonary disease with (acute) exacerbation: Secondary | ICD-10-CM | POA: Insufficient documentation

## 2015-07-23 HISTORY — DX: Bipolar disorder, unspecified: F31.9

## 2015-07-23 HISTORY — DX: Chronic obstructive pulmonary disease, unspecified: J44.9

## 2015-07-23 HISTORY — DX: Functional disorders of polymorphonuclear neutrophils: D71

## 2015-07-23 HISTORY — DX: Polyneuropathy, unspecified: G62.9

## 2015-07-23 HISTORY — DX: Pure hypercholesterolemia, unspecified: E78.00

## 2015-07-23 HISTORY — DX: Crohn's disease, unspecified, without complications: K50.90

## 2015-07-23 HISTORY — DX: Fibromyalgia: M79.7

## 2015-07-23 HISTORY — DX: Other functional disorders of polymorphonuclear neutrophils: D71.8

## 2015-07-23 HISTORY — DX: Essential (primary) hypertension: I10

## 2015-07-23 LAB — BASIC METABOLIC PANEL
Anion gap: 8 (ref 5–15)
BUN: 15 mg/dL (ref 6–20)
CALCIUM: 8.9 mg/dL (ref 8.9–10.3)
CO2: 24 mmol/L (ref 22–32)
CREATININE: 0.91 mg/dL (ref 0.44–1.00)
Chloride: 105 mmol/L (ref 101–111)
GFR calc Af Amer: >60 mL/min (ref >60)
GLUCOSE: 107 mg/dL — AB (ref 65–99)
POTASSIUM: 3.7 mmol/L (ref 3.5–5.1)
SODIUM: 137 mmol/L (ref 135–145)

## 2015-07-23 LAB — CBC
HEMATOCRIT: 38.5 % (ref 35.0–47.0)
Hemoglobin: 13.1 g/dL (ref 12.0–16.0)
MCH: 26.9 pg (ref 26.0–34.0)
MCHC: 34 g/dL (ref 32.0–36.0)
MCV: 79.2 fL — ABNORMAL LOW (ref 80.0–100.0)
PLATELETS: 266 10*3/uL (ref 150–440)
RBC: 4.87 MIL/uL (ref 3.80–5.20)
RDW: 16.5 % — AB (ref 11.5–14.5)
WBC: 12.4 10*3/uL — AB (ref 3.6–11.0)

## 2015-07-23 LAB — TROPONIN I

## 2015-07-23 LAB — BRAIN NATRIURETIC PEPTIDE: B NATRIURETIC PEPTIDE 5: 21 pg/mL (ref 0.0–100.0)

## 2015-07-23 MED ORDER — IPRATROPIUM-ALBUTEROL 0.5-2.5 (3) MG/3ML IN SOLN
3.0000 mL | Freq: Once | RESPIRATORY_TRACT | Status: AC
  Administered 2015-07-23: 3 mL via RESPIRATORY_TRACT
  Filled 2015-07-23: qty 3

## 2015-07-23 NOTE — ED Provider Notes (Signed)
[facility] Emergency Department Provider Note  ____________________________________________  Time seen: Approximately 9:27 PM  I have reviewed the triage vital signs and the nursing notes.   HISTORY  Chief Complaint Chest Pain and Shortness of Breath   HPI Misty Everett is a 50 y.o. female history of hypertension, hypercholesterolemia, paroxysmal atrial fibrillation, chronic granulomatous disease, COPD, and bipolar who presents for evaluation of chest pain and shortness of breath. Patient reports that her symptoms have been going on for 3 weeks. Shortness of breath got worse today which prompted her to call EMS. Per EMS patient had diffuse expiratory wheezes and received 2 DuoNeb treatments in route with improvement of her shortness of breath. Patient also endorses orthopnea and has been sleeping on 3 pillows. She denies fever. She does report worsening of her baseline cough with production of white sputum. She also endorses intermittent chest tightness, nonradiating, substernal, and has been intermittent for the last 3 weeks. She denies dyspnea on exertion. She reports occasional palpitations and reports one episode of syncope last week when she was outside with her husband and felt hot and had a syncopal episode. She has not seen a doctor in over a year. She was supposed to see cardiology in [place] before moving to [place] for pafib and to rule out heart failure but never made her appointment. Patient reports she has been taking torsemide 30-40 mg a day from her husband and has been using her albuterol inhaler at home with improvement of her SOB until today. She has been non compliant with her antihypertensive medications since January as she moved here from [place] without insurance and has not had a chance to see a doctor. She continues to smoke. Denies personal or family history of ischemic heart disease.  Past Medical History  Diagnosis Date  .  Hypertension   . Hypercholesteremia   . Chronic granulomatous disease (HCC)   . COPD (chronic obstructive pulmonary disease) (HCC)   . Fibromyalgia   . Crohn's disease (HCC)   . Peripheral neuropathy (HCC)   . Bipolar affective (HCC)     There are no active problems to display for this patient.   Past Surgical History  Procedure Laterality Date  . Lumbar hernia repair    . Cholecystectomy    . Tonsillectomy    . Tubal ligation    . Foot surgery      No current outpatient prescriptions on file.  Allergies Penicillins; Morphine and related; and Risperdal  No family history on file.  Social History Social History  Substance Use Topics  . Smoking status: Current Every Day Smoker -- 1.00 packs/day    Types: Cigarettes  . Smokeless tobacco: None  . Alcohol Use: Yes     Comment: rarely    Review of Systems Constitutional: Negative for fever. + syncope Eyes: Negative for visual changes. ENT: Negative for sore throat. Cardiovascular: + chest pain and orthopnea Respiratory: + shortness of breath, wheezing, cough Gastrointestinal: Negative for abdominal pain, vomiting or diarrhea. Genitourinary: Negative for dysuria. Musculoskeletal: Negative for back pain. Skin: Negative for rash. Neurological: Negative for headaches, weakness or numbness.  ____________________________________________   PHYSICAL EXAM:  VITAL SIGNS: ED Triage Vitals  Enc Vitals Group     BP 07/23/15 2036 153/90 mmHg     Pulse Rate 07/23/15 2036 79     Resp 07/23/15 2036 24     Temp 07/23/15 2036 98.2 F (36.8 C)     Temp Source 07/23/15 2036 Oral  SpO2 07/23/15 2036 98 %     Weight 07/23/15 2036 170 lb (77.111 kg)     Height 07/23/15 2036 5\' 2"  (1.575 m)     Head Cir --      Peak Flow --      Pain Score 07/23/15 2037 3     Pain Loc --      Pain Edu? --      Excl. in GC? --     Constitutional: Alert and oriented. Well appearing and in no apparent distress. HEENT:      Head:  Normocephalic and atraumatic.         Eyes: Conjunctivae are normal. Sclera is non-icteric. EOMI. PERRL      Mouth/Throat: Mucous membranes are moist.       Neck: Supple with no signs of meningismus. Cardiovascular: Regular rate and rhythm. No murmurs, gallops, or rubs. 2+ symmetrical distal pulses are present in all extremities. JVD elevated 3 finger breaths above the clavicle. Respiratory: Tachypnea But maintaining her sats on room air, mildly increased work of breathing, diffuse expiratory wheezes bilaterally, no crackles.  Gastrointestinal: Soft, non tender, and non distended with positive bowel sounds. No rebound or guarding. Genitourinary: No suprapubic tenderness. No CVA tenderness. Musculoskeletal: Nontender with normal range of motion in all extremities. No edema, cyanosis, or erythema of extremities. Neurologic: Normal speech and language. Face is symmetric. Moving all extremities. No gross focal neurologic deficits are appreciated. Skin: Skin is warm, dry and intact. No rash noted. Psychiatric: Mood and affect are normal. Speech and behavior are normal.  ____________________________________________   LABS (all labs ordered are listed, but only abnormal results are displayed)  Labs Reviewed  BASIC METABOLIC PANEL - Abnormal; Notable for the following:    Glucose, Bld 107 (*)    All other components within normal limits  CBC - Abnormal; Notable for the following:    WBC 12.4 (*)    MCV 79.2 (*)    RDW 16.5 (*)    All other components within normal limits  TROPONIN I  BRAIN NATRIURETIC PEPTIDE  TROPONIN I   ____________________________________________  EKG  ED ECG REPORT I, Nita Sickle, the attending physician, personally viewed and interpreted this ECG.  Sinus rhythm with occasional PVCs, rate of 74, normal intervals, normal axis, no ST elevations or depressions. No prior for comparison. ____________________________________________  RADIOLOGY  CXR: negative    ____________________________________________   PROCEDURES  Procedure(s) performed: None Critical Care performed:  None ____________________________________________   INITIAL IMPRESSION / ASSESSMENT AND PLAN / ED COURSE  50 y.o. female history of hypertension, hypercholesterolemia, paroxysmal atrial fibrillation, chronic granulomatous disease, COPD, and bipolar who presents for evaluation of worsening shortness of breath, orthopnea, wheezing, and cough for 3 weeks. Patient with mild increased work of breathing after receiving 2 DuoNeb treatments per EMS, continues to wheeze at this time. Patient has elevated JVD but no pitting edema no crackles on exam. EKG with no evidence of ischemia showing occasional PVCs. We'll watch patient on telemetry, check labs including BNP, BMP, troponin, CBC, get a chest x-ray. We'll treat with DuoNeb treatments. Patient received Solu-Medrol per EMS. Presentation concerning for COPD exacerbation possibly mild CHF as well.  10:46 PM Troponin, BNP, CBC, and BMP within normal limits. Chest x-ray unremarkable. Patient continues to have mild wheezing however moving great air, normal work of breathing, and satting 98% on room air. I have discussed the patient the results of her labs and recommended a second troponin 3 hours from the first one.  Patient is in agreement with this workup plan at this time. No arrhythmia other than occasional PVCs on telemetry.  11:30 PM Patient left AMA while I was with another patient. Unable to discuss return precautions with patient however she had been given the information about the open clinic and I had already talked to her that if her 2nd troponin was negative that she would be discharged to f/u at the clinic for further evaluation including stress test.  Pertinent labs & imaging results that were available during my care of the patient were reviewed by me and considered in my medical decision making (see chart for  details).    ____________________________________________   FINAL CLINICAL IMPRESSION(S) / ED DIAGNOSES  Final diagnoses:  COPD exacerbation (HCC)      NEW MEDICATIONS STARTED DURING THIS VISIT:  There are no discharge medications for this patient.    Note:  This document was prepared using Dragon voice recognition software and may include unintentional dictation errors.    Nita Sickle, MD 07/23/15 2352

## 2015-07-23 NOTE — ED Notes (Addendum)
Patient presents to ED via [facility] from home c/o mid chest pain intermittent over two weeks, pt reports shortness of breath worse over the past two days. Pt having pursed lip breathing, labored breathing. Pt reports also passing out a two weeks ago and reports hitting head and LOC. Pt reports has not been taking bp pressure medicine, along with others, for 6 months. Pt states "I feel like my heart is flip-flopping." Pt reports feeling better sitting up than laying down.  EMS gave patient 125 mg Solu-Medrol and 2 Duo Neb breathing treatments. Pt states she is "breathing a little better."

## 2015-07-23 NOTE — ED Notes (Signed)
Pt refusing to stay in hospital.  Pt ripped IV out and states "If I'm not dying I'm not staying.  I'm ill and I don't want to be here."  Gauze given and AMA signed.  Charge nurse notified.  MD not available to notify at this time, but will notify as soon as possible.

## 2015-07-23 NOTE — ED Notes (Signed)
MD at bedside. 

## 2017-05-05 ENCOUNTER — Emergency Department
Admission: EM | Admit: 2017-05-05 | Discharge: 2017-05-05 | Disposition: A | Discharge status: Home / Self Care | Payer: Self-pay | Attending: Emergency Medicine | Admitting: Emergency Medicine

## 2017-05-05 ENCOUNTER — Emergency Department: Payer: Self-pay

## 2017-05-05 DIAGNOSIS — R1084 Generalized abdominal pain: Secondary | ICD-10-CM | POA: Insufficient documentation

## 2017-05-05 DIAGNOSIS — I10 Essential (primary) hypertension: Secondary | ICD-10-CM | POA: Insufficient documentation

## 2017-05-05 DIAGNOSIS — F17210 Nicotine dependence, cigarettes, uncomplicated: Secondary | ICD-10-CM | POA: Insufficient documentation

## 2017-05-05 DIAGNOSIS — J449 Chronic obstructive pulmonary disease, unspecified: Secondary | ICD-10-CM | POA: Insufficient documentation

## 2017-05-05 DIAGNOSIS — Z79899 Other long term (current) drug therapy: Secondary | ICD-10-CM | POA: Insufficient documentation

## 2017-05-05 DIAGNOSIS — K5090 Crohn's disease, unspecified, without complications: Secondary | ICD-10-CM | POA: Insufficient documentation

## 2017-05-05 HISTORY — DX: Unspecified asthma, uncomplicated: J45.909

## 2017-05-05 LAB — URINALYSIS, COMPLETE (UACMP) WITH MICROSCOPIC
BILIRUBIN URINE: NEGATIVE
Bacteria, UA: NONE SEEN
GLUCOSE, UA: NEGATIVE mg/dL
Hgb urine dipstick: NEGATIVE
KETONES UR: NEGATIVE mg/dL
LEUKOCYTES UA: NEGATIVE
Nitrite: NEGATIVE
PH: 6 (ref 5.0–8.0)
Protein, ur: NEGATIVE mg/dL
SPECIFIC GRAVITY, URINE: 1.01 (ref 1.005–1.030)

## 2017-05-05 LAB — COMPREHENSIVE METABOLIC PANEL
ALK PHOS: 72 U/L (ref 38–126)
ALT: 14 U/L (ref 14–54)
AST: 16 U/L (ref 15–41)
Albumin: 4.3 g/dL (ref 3.5–5.0)
Anion gap: 9 (ref 5–15)
BILIRUBIN TOTAL: 0.6 mg/dL (ref 0.3–1.2)
BUN: 15 mg/dL (ref 6–20)
CALCIUM: 9.2 mg/dL (ref 8.9–10.3)
CO2: 26 mmol/L (ref 22–32)
CREATININE: 0.77 mg/dL (ref 0.44–1.00)
Chloride: 95 mmol/L — ABNORMAL LOW (ref 101–111)
Glucose, Bld: 111 mg/dL — ABNORMAL HIGH (ref 65–99)
Potassium: 3.9 mmol/L (ref 3.5–5.1)
Sodium: 130 mmol/L — ABNORMAL LOW (ref 135–145)
Total Protein: 8.1 g/dL (ref 6.5–8.1)

## 2017-05-05 LAB — CBC
HCT: 40.9 % (ref 35.0–47.0)
Hemoglobin: 13.7 g/dL (ref 12.0–16.0)
MCH: 27.1 pg (ref 26.0–34.0)
MCHC: 33.4 g/dL (ref 32.0–36.0)
MCV: 81.2 fL (ref 80.0–100.0)
PLATELETS: 317 10*3/uL (ref 150–440)
RBC: 5.04 MIL/uL (ref 3.80–5.20)
RDW: 17.1 % — AB (ref 11.5–14.5)
WBC: 9.1 10*3/uL (ref 3.6–11.0)

## 2017-05-05 LAB — PREGNANCY, URINE: Preg Test, Ur: NEGATIVE

## 2017-05-05 LAB — LIPASE, BLOOD: Lipase: 52 U/L — ABNORMAL HIGH (ref 11–51)

## 2017-05-05 MED ORDER — SODIUM CHLORIDE 0.9 % IV BOLUS
1000.0000 mL | Freq: Once | INTRAVENOUS | Status: AC
  Administered 2017-05-05: 1000 mL via INTRAVENOUS

## 2017-05-05 MED ORDER — PREDNISONE 10 MG (21) PO TBPK: ORAL_TABLET | ORAL | 0 refills | Status: AC

## 2017-05-05 MED ORDER — DICYCLOMINE HCL 20 MG PO TABS: 20.0000 mg | ORAL_TABLET | Freq: Three times a day (TID) | ORAL | 0 refills | Status: AC | PRN

## 2017-05-05 MED ORDER — IOPAMIDOL (ISOVUE-370) INJECTION 76%
75.0000 mL | Freq: Once | INTRAVENOUS | Status: AC | PRN
  Administered 2017-05-05: 75 mL via INTRAVENOUS

## 2017-05-05 MED ORDER — FLUCONAZOLE 150 MG PO TABS: 150.0000 mg | ORAL_TABLET | Freq: Every day | ORAL | 1 refills | Status: AC

## 2017-05-05 MED ORDER — DICYCLOMINE HCL 10 MG/ML IM SOLN
20.0000 mg | Freq: Once | INTRAMUSCULAR | Status: AC
  Administered 2017-05-05: 20 mg via INTRAMUSCULAR
  Filled 2017-05-05: qty 2

## 2017-05-05 MED ORDER — METHYLPREDNISOLONE SODIUM SUCC 125 MG IJ SOLR
125.0000 mg | Freq: Once | INTRAMUSCULAR | Status: AC
  Administered 2017-05-05: 125 mg via INTRAVENOUS
  Filled 2017-05-05: qty 2

## 2017-05-05 NOTE — Discharge Instructions (Addendum)
Please seek medical attention for any high fevers, chest pain, shortness of breath, change in behavior, persistent vomiting, bloody stool or any other new or concerning symptoms.  

## 2017-05-05 NOTE — ED Notes (Signed)
AaoX3.  SKIN WARM AND DRY. nad  Ambulates with easy and steady gait. NAD

## 2017-05-05 NOTE — ED Triage Notes (Signed)
Patient to ED from home via [facility] c/o abdominal pain. Per EMS patient has had abdominal pain +6 weeks with a hx of crohns disease noted. Patient reports NVD and abdominal pain is generalized but worse in the middle upper quadrant. She is alert and oriented x4 no acute distress noted.

## 2017-05-05 NOTE — ED Notes (Signed)
Patient transported to CT 

## 2017-05-05 NOTE — ED Provider Notes (Signed)
[facility] Emergency Department Provider Note   ____________________________________________   I have reviewed the triage vital signs and the nursing notes.   HISTORY  Chief Complaint Abdominal Pain   History limited by: Not Limited   HPI Misty Everett is a 52 y.o. female who presents to the emergency department today because of concerns for abdominal pain.  She states is located primarily across the upper part of her abdomen.  It has been going on and off for the past 6 weeks.  It is exacerbated by movement.  She describes as a twisting sensation.  She has a history of Crohn's disease and initially thought it could be related to that however it became more severe than she is used to.  Patient has noticed intermittent constipation and hard stools with diarrhea.  She denies any fevers.   Per medical record review patient has a history of crohns disease, fibromyalgia.   Past Medical History:  Diagnosis Date  . Bipolar affective (HCC)   . Chronic granulomatous disease (HCC)   . COPD (chronic obstructive pulmonary disease) (HCC)   . Crohn's disease (HCC)   . Fibromyalgia   . Hypercholesteremia   . Hypertension   . Peripheral neuropathy     There are no active problems to display for this patient.   Past Surgical History:  Procedure Laterality Date  . CHOLECYSTECTOMY    . FOOT SURGERY    . lumbar hernia repair    . TONSILLECTOMY    . TUBAL LIGATION      Prior to Admission medications   Medication Sig Start Date End Date Taking? Authorizing Provider  gabapentin (NEURONTIN) 300 MG capsule Take 300 mg by mouth at bedtime.   Yes [provider]  lisinopril (PRINIVIL,ZESTRIL) 10 MG tablet Take 10 mg by mouth daily.   Yes [provider]  loratadine (CLARITIN) 10 MG tablet Take 10 mg by mouth daily.   Yes [provider]  omeprazole (PRILOSEC) 40 MG capsule Take 40-80 mg by mouth daily.   Yes [provider]     Allergies Penicillins; Keflex [cephalexin]; Morphine and related; and Risperdal [risperidone]  History reviewed. No pertinent family history.  Social History Social History   Tobacco Use  . Smoking status: Current Every Day Smoker    Packs/day: 1.00    Types: Cigarettes  . Smokeless tobacco: Never Used  Substance Use Topics  . Alcohol use: Yes    Comment: rarely  . Drug use: Not on file    Review of Systems Constitutional: No fever/chills Eyes: No visual changes. ENT: No sore throat. Cardiovascular: Denies chest pain. Respiratory: Denies shortness of breath. Gastrointestinal: Positive for abdominal pain and nausea.  Genitourinary: Negative for dysuria. Musculoskeletal: Positive for low back pain. Skin: Negative for rash. Neurological: Negative for headaches, focal weakness or numbness.  ____________________________________________   PHYSICAL EXAM:  VITAL SIGNS: ED Triage Vitals  Enc Vitals Group     BP 05/05/17 1041 121/67     Pulse Rate 05/05/17 1041 90     Resp 05/05/17 1041 20     Temp 05/05/17 1041 98.4 F (36.9 C)     Temp Source 05/05/17 1041 Oral     SpO2 05/05/17 1035 99 %     Weight 05/05/17 1045 140 lb (63.5 kg)     Height 05/05/17 1045 5\' 2"  (1.575 m)     Head Circumference --      Peak Flow --      Pain Score 05/05/17 1045  3   Constitutional: Alert and oriented. Well appearing and in no distress. Eyes: Conjunctivae are normal.  ENT   Head: Normocephalic and atraumatic.   Nose: No congestion/rhinnorhea.   Mouth/Throat: Mucous membranes are moist.   Neck: No stridor. Hematological/Lymphatic/Immunilogical: No cervical lymphadenopathy. Cardiovascular: Normal rate, regular rhythm.  No murmurs, rubs, or gallops. Respiratory: Normal respiratory effort without tachypnea nor retractions. Breath sounds are clear and equal bilaterally. No wheezes/rales/rhonchi. Gastrointestinal: Soft and diffusely tender to palpation. Genitourinary:  Deferred Musculoskeletal: Normal range of motion in all extremities. No lower extremity edema. Neurologic:  Normal speech and language. No gross focal neurologic deficits are appreciated.  Skin:  Skin is warm, dry and intact. No rash noted. Psychiatric: Mood and affect are normal. Speech and behavior are normal. Patient exhibits appropriate insight and judgment.  ____________________________________________    LABS (pertinent positives/negatives)  CBC wbc 9.1, hgb 13.7, plt 317 CMP na 130, k 3.9, cl 95, glu 111, cr 0.77 Lipase 52  ____________________________________________   EKG  I, Phineas Semen, attending physician, personally viewed and interpreted this EKG  EKG Time: 1038 Rate: 86 Rhythm: sinus rhythm with PVC Axis: normal Intervals: qtc 435 QRS: narrow ST changes: no st elevation Impression: abnormal ekg   ____________________________________________    RADIOLOGY  CT abd/pel No acute disease  ____________________________________________   PROCEDURES  Procedures  ____________________________________________   INITIAL IMPRESSION / ASSESSMENT AND PLAN / ED COURSE  Pertinent labs & imaging results that were available during my care of the patient were reviewed by me and considered in my medical decision making (see chart for details).  Patient presented to the emergency department today because of concerns for abdominal pain.  The patient did have some diffuse tenderness on exam.  Her blood work was not concerning given exam did have concerns for possible diverticulitis, gastritis, gastroenteritis, intra-abdominal infection, Crohn's disease amongst other etiologies.  CT scan was obtained which did not show any concerning acute intra-abdominal pathology.  Patient felt better after IV fluids and Bentyl.  Will give steroids and Bentyl for likely Crohn's flare.  Will give patient GI follow-up.  _______________________________________   FINAL CLINICAL  IMPRESSION(S) / ED DIAGNOSES  Final diagnoses:  Generalized abdominal pain     Note: This dictation was prepared with Dragon dictation. Any transcriptional errors that result from this process are unintentional     Phineas Semen, MD 05/05/17 1451

## 2018-02-11 DEATH — deceased

## 2019-10-20 IMAGING — CT CT ABD-PELV W/ CM
2 of 5 series · 15 of 46 positions shown, 17 images · IV contrast (APPLIED)
Comparison: None.

CLINICAL DATA: Abdominal pain. History of Crohn's disease. Nausea
and vomiting

EXAM:
CT ABDOMEN AND PELVIS WITH CONTRAST
TECHNIQUE: Multidetector CT imaging of the abdomen and pelvis was performed
using the standard protocol following bolus administration of
intravenous contrast.
CONTRAST:  75mL D9W23Y-ASF IOPAMIDOL (D9W23Y-ASF) INJECTION 76%

[Series 2: routine abd/pel with · axial · 0.67mm/px · z∈[-1054,-650]mm · 12 of 93 slices shown, 14 images]
[im 6/93  soft-tissue]
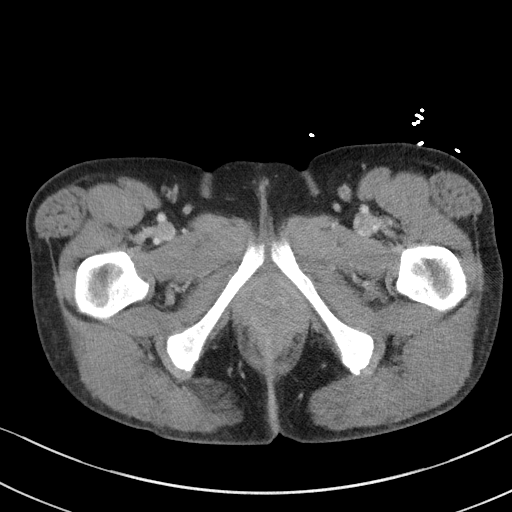
[im 6/93  bone]
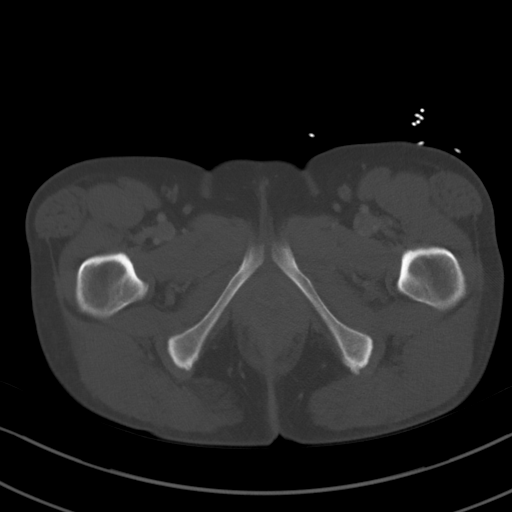
[im 16/93  soft-tissue]
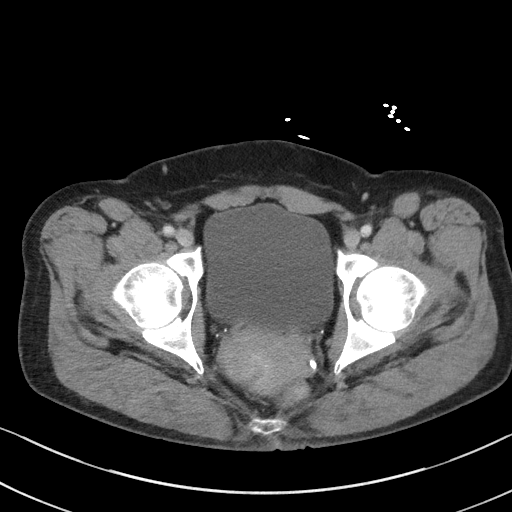
[im 21/93  soft-tissue]
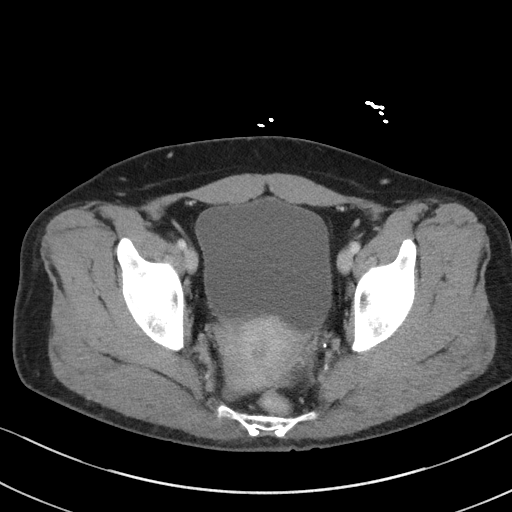
[im 26/93  soft-tissue]
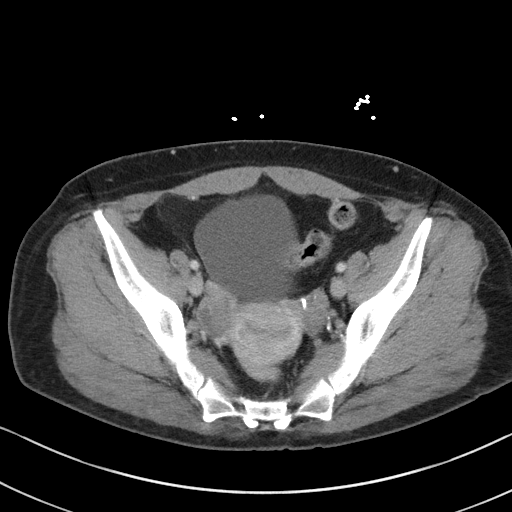
[im 36/93  soft-tissue]
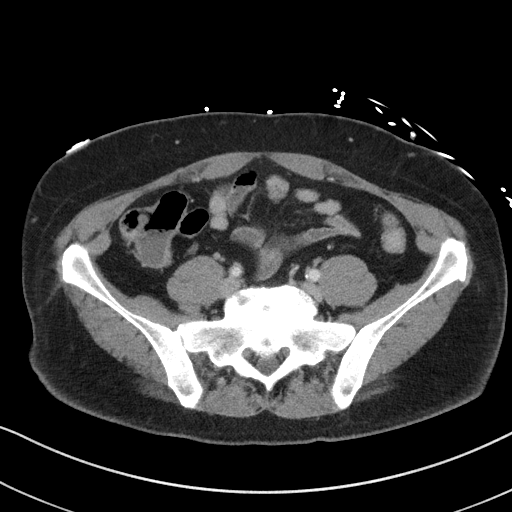
[im 41/93  soft-tissue]
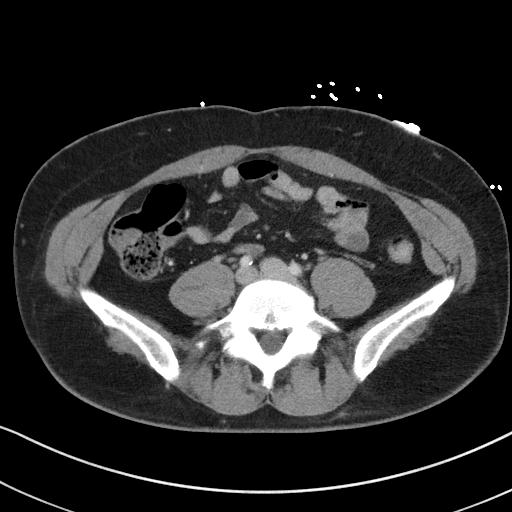
[im 52/93  soft-tissue]
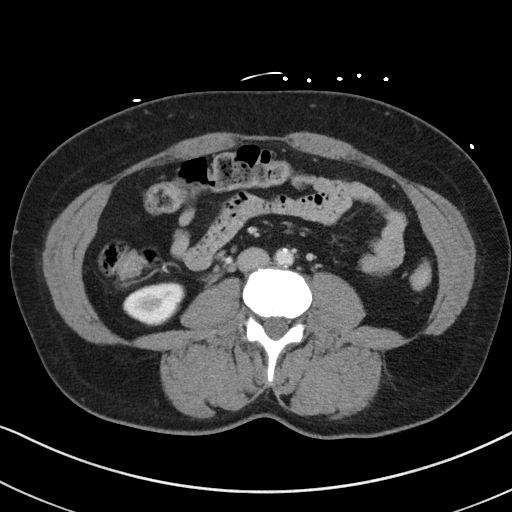
[im 57/93  soft-tissue]
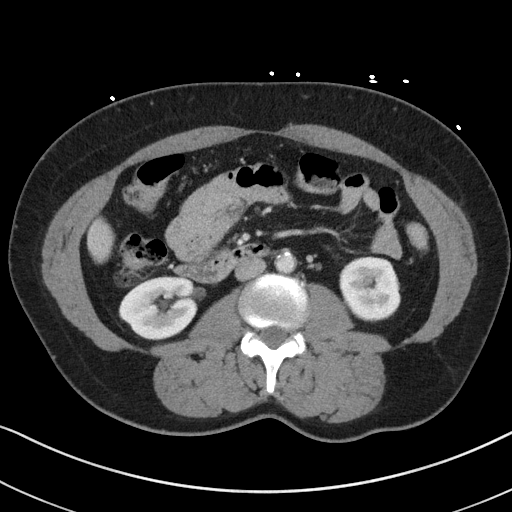
[im 67/93  soft-tissue]
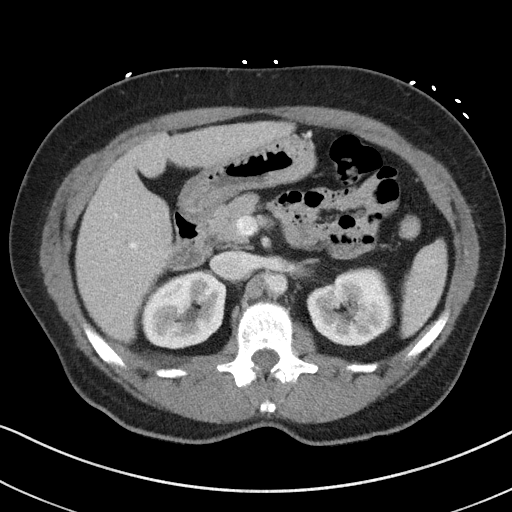
[im 67/93  bone]
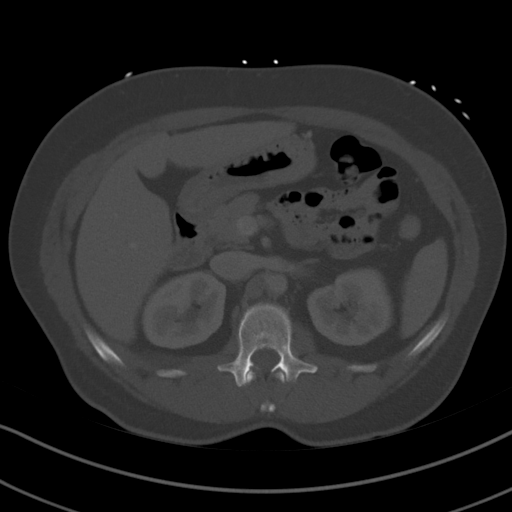
[im 72/93  soft-tissue]
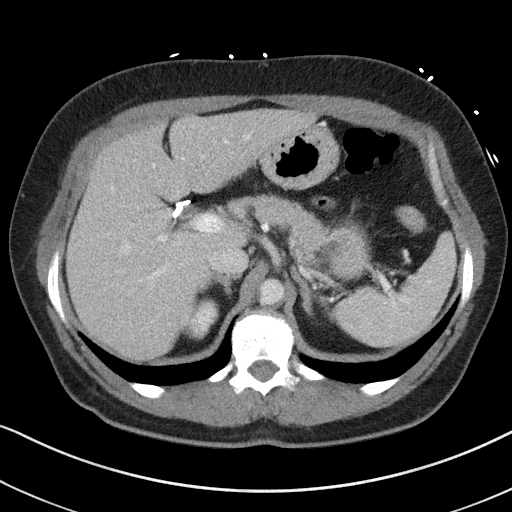
[im 77/93  soft-tissue]
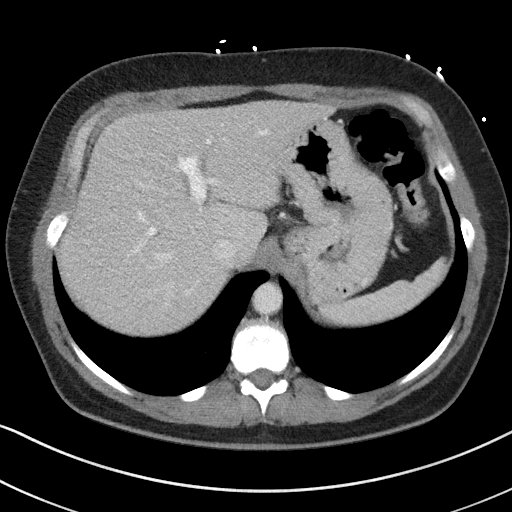
[im 87/93  soft-tissue]
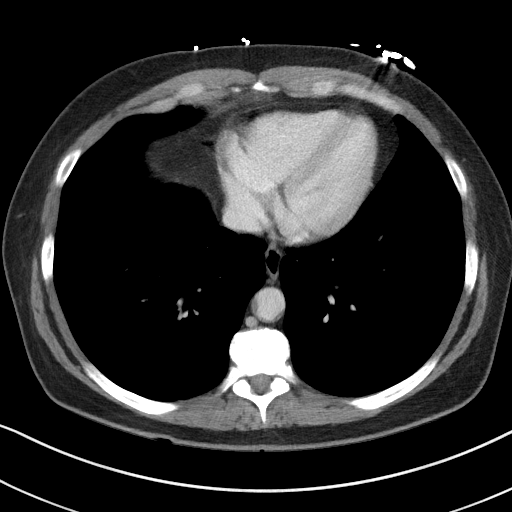

[Series 5: coronal st · coronal · 0.69mm/px · 3 of 82 slices shown]
[im 28/82  soft-tissue]
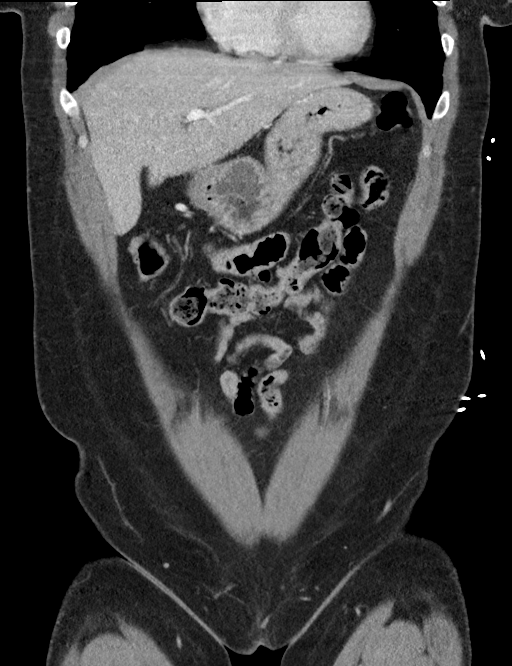
[im 37/82  soft-tissue]
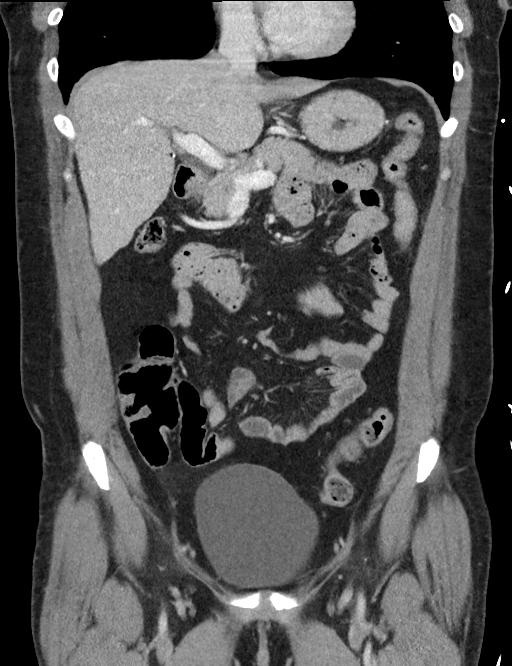
[im 46/82  soft-tissue]
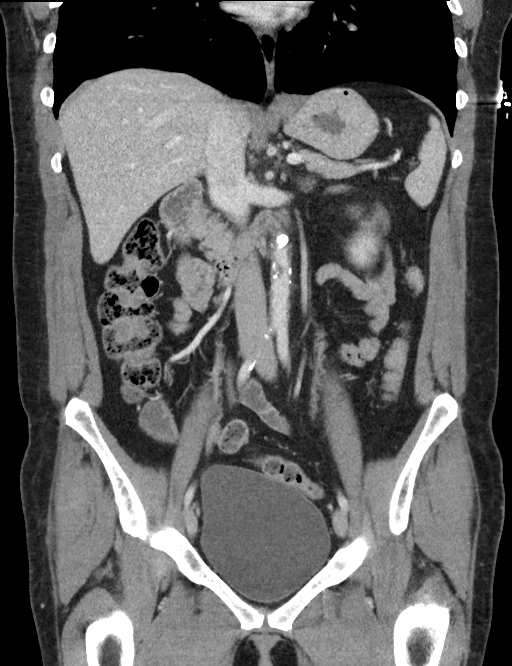

[15 of 46 positions shown; findings below may reference images not displayed]

FINDINGS: Lower chest: Lung bases are clear.

Hepatobiliary: No focal liver lesions are evident. Gallbladder is
absent. There is no appreciable biliary duct dilatation.

Pancreas: No pancreatic mass or inflammatory focus.

Spleen: No evidence splenic lesion.

Adrenals/Urinary Tract: Adrenals bilaterally appear normal. Kidneys
bilaterally show no evident mass or hydronephrosis on either side.
There is no appreciable renal or ureteral calculus on either side.
Urinary bladder is midline with wall thickness within normal limits.

Stomach/Bowel: There is no appreciable bowel wall or mesenteric
thickening. No evident bowel obstruction. No free air or portal
venous air evident. Note that the terminal ileal region appears
unremarkable without wall thickening or fistula.

Vascular/Lymphatic: There is atherosclerotic calcification and
plaque in the aorta and common iliac arteries. No aneurysm.
Mesenteric arterial vascular structures appear patent. There is no
appreciable adenopathy in the abdomen or pelvis.

Reproductive: Uterus appears normal. There is no evident pelvic
mass. There are tubal ligation clips bilaterally.

Other: Appendix appears normal. There is no ascites or abscess in
the abdomen or pelvis.

Musculoskeletal: There is marked disc space narrowing at L5-S1.
There are no blastic or lytic bone lesions. Sacroiliac joints appear
normal bilaterally. There are no intramuscular or abdominal wall
lesions evident.
IMPRESSION: 1. No bowel wall thickening or bowel obstruction. Terminal ileum
region appears normal. No fistula. No appreciable mesenteric
thickening. No abscess. Appendix appears normal.

2.  No renal or ureteral calculus.  No hydronephrosis.

3.  Foci of aortic and iliac artery atherosclerosis noted.

4.  Gallbladder absent.

5.  Tubal ligation clips present bilaterally.

Aortic Atherosclerosis ([id number]-N11.1).

## 7119-10-12 DEATH — deceased
